# Patient Record
Sex: Male | Born: 1975 | Race: White | Hispanic: No | Marital: Married | Smoking: Former smoker
Health system: Southern US, Community
[De-identification: ages and names within clinical notes are randomized; demographics above are authoritative.]

## PROBLEM LIST (undated history)

## (undated) DIAGNOSIS — M549 Dorsalgia, unspecified: Secondary | ICD-10-CM

## (undated) DIAGNOSIS — I340 Nonrheumatic mitral (valve) insufficiency: Secondary | ICD-10-CM

## (undated) DIAGNOSIS — M1990 Unspecified osteoarthritis, unspecified site: Secondary | ICD-10-CM

## (undated) HISTORY — DX: Nonrheumatic mitral (valve) insufficiency: I34.0

---

## 2009-06-16 HISTORY — PX: HERNIA REPAIR: SHX51

## 2017-03-03 ENCOUNTER — Ambulatory Visit (INDEPENDENT_AMBULATORY_CARE_PROVIDER_SITE_OTHER): Payer: BLUE CROSS/BLUE SHIELD | Admitting: Neurology

## 2017-03-03 ENCOUNTER — Encounter (INDEPENDENT_AMBULATORY_CARE_PROVIDER_SITE_OTHER): Payer: Self-pay | Admitting: Neurology

## 2017-03-03 DIAGNOSIS — G5601 Carpal tunnel syndrome, right upper limb: Secondary | ICD-10-CM | POA: Diagnosis not present

## 2017-03-03 DIAGNOSIS — Z0289 Encounter for other administrative examinations: Secondary | ICD-10-CM

## 2017-03-03 DIAGNOSIS — M79641 Pain in right hand: Secondary | ICD-10-CM | POA: Diagnosis not present

## 2017-03-03 NOTE — Progress Notes (Signed)
Full Name: Sean Cuevas Gender: Male MRN #: [id number] Date of Birth: 1975/11/25    Visit Date: 03/03/2017 13:43 Age: 41 Years 3 Months Old Referring Physician: Dr Nicholos Johns    History: Sean Cuevas is a 41 year old man with several months of numbness and pain in the hands, right much more than left.    Also over the last 2 months he has noted mild weakness in the right hand. On examination he has numbness over the thenar eminence on the right. The right APB muscle is mildly weak. He has a positive Tinel sign and Phalen's sign, right much more than left.  Nerve conduction studies:   The right median motor response had a delayed distal latency but normal amplitude and forearm conduction. The left median motor, left ulnar motor and right ulnar motor had normal distal latencies, amplitudes and forearm/upper arm conduction velocity.   Right median sensory response had a delayed peak latency and low normal amplitude. Bilateral median and ulnar F-wave latencies were normal The left median sensory response in both ulnar sensory responses had normal peak latencies and amplitudes.  Needle EMG:  Needle EMG of of the right deltoid, triceps, biceps, EDC, first dorsal interosseous and APB muscles.  There was no abnormal spontaneous activity noted in any of the muscles. All the muscles had normal motor unit action potentials and recruitment.  Impression: 1.    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    No evidence of superimposed polyneuropathy or radiculopathy.  Sean A. Epimenio Foot, MD, PhD, FAAN Certified in Neurology, Clinical Neurophysiology, Sleep Medicine, Pain Medicine and Neuroimaging Director, [facility]  [facility] [address] [place], [place] [address] [phone]   ADDENDUM:   Using sterile technique, the right carpal tunnel was injected with 1.5 mg Decadron in 0.8  mL lidocaine. He tolerated the procedure well and there were no complications. Pain was better afterwards.   We discussed that he might benefit from surgery.  --- RAS      MNC    Nerve / Sites Muscle Latency Ref. Amplitude Ref. Rel Amp Segments Distance Velocity Ref. Area    ms ms mV mV %  cm m/s m/s mVms  L Median - APB     Wrist APB 3.5 ?4.4 12.4 ?4.0 100 Wrist - APB 7   46.0     Upper arm APB 7.8  10.3  83.5 Upper arm - Wrist 27 62 ?49 36.3  R Median - APB     Wrist APB 4.8 ?4.4 10.8 ?4.0 100 Wrist - APB 7   40.3     Upper arm APB 8.0  10.4  96.1 Upper arm - Wrist 27 84 ?49 37.1  L Ulnar - ADM     Wrist ADM 2.8 ?3.3 13.2 ?6.0 100 Wrist - ADM 7   43.9     B.Elbow ADM 7.0  12.6  95.2 B.Elbow - Wrist 23 55 ?49 41.6     A.Elbow ADM 6.8  11.4  90.5 A.Elbow - B.Elbow 10 640 ?49 39.2         A.Elbow - Wrist      R Ulnar - ADM     Wrist ADM 2.4 ?3.3 10.7 ?6.0 100 Wrist - ADM 7   28.9     B.Elbow ADM 7.1  9.7  90.6 B.Elbow - Wrist 25 54 ?49 28.2     A.Elbow ADM  8.6  9.2  94.8 A.Elbow - B.Elbow 10 64 ?49 27.8         A.Elbow - Wrist                 SNC    Nerve / Sites Rec. Site Peak Lat Amp Segments Distance    ms V  cm  L Median - Orthodromic (Dig II, Mid palm)     Dig II Wrist 3.4 30 Dig II - Wrist 13  R Median - Orthodromic (Dig II, Mid palm)     Dig II Wrist 4.4 22 Dig II - Wrist 13  L Ulnar - Orthodromic, (Dig V, Mid palm)     Dig V Wrist 3.1 37 Dig V - Wrist 11  R Ulnar - Orthodromic, (Dig V, Mid palm)     Dig V Wrist 3.1 30 Dig V - Wrist 82             F  Wave    Nerve F Lat Ref.   ms ms  L Median - APB 31.8 ?31.0  L Ulnar - ADM 30.8 ?32.0  R Median - APB 28.5 ?31.0  R Ulnar - ADM 29.8 ?32.0             EMG       EMG Summary Table    Spontaneous MUAP Recruitment  Muscle IA Fib PSW Fasc Other Amp Dur. Poly Pattern  R. Deltoid Normal None None None _______ Normal Normal Normal Normal  R. Triceps brachii Normal None None None _______ Normal Normal Normal Normal    R. Biceps brachii Normal None None None _______ Normal Normal Normal Normal  R. Extensor digitorum communis Normal None None None _______ Normal Normal Normal Normal  R. First dorsal interosseous Normal None None None _______ Normal Normal Normal Normal   R. APB Normal None None None _______ Normal Normal Normal Normal

## 2017-08-30 ENCOUNTER — Encounter (HOSPITAL_COMMUNITY): Payer: Self-pay | Admitting: Emergency Medicine

## 2017-08-30 ENCOUNTER — Emergency Department (HOSPITAL_COMMUNITY)
Admission: EM | Admit: 2017-08-30 | Discharge: 2017-08-30 | Disposition: A | Discharge status: Home / Self Care | Payer: Commercial Managed Care - PPO | Attending: Emergency Medicine | Admitting: Emergency Medicine

## 2017-08-30 DIAGNOSIS — Y9389 Activity, other specified: Secondary | ICD-10-CM | POA: Insufficient documentation

## 2017-08-30 DIAGNOSIS — Y929 Unspecified place or not applicable: Secondary | ICD-10-CM | POA: Insufficient documentation

## 2017-08-30 DIAGNOSIS — S79922A Unspecified injury of left thigh, initial encounter: Secondary | ICD-10-CM | POA: Diagnosis present

## 2017-08-30 DIAGNOSIS — M79605 Pain in left leg: Secondary | ICD-10-CM | POA: Diagnosis not present

## 2017-08-30 DIAGNOSIS — S71112A Laceration without foreign body, left thigh, initial encounter: Secondary | ICD-10-CM | POA: Diagnosis not present

## 2017-08-30 DIAGNOSIS — S76122A Laceration of left quadriceps muscle, fascia and tendon, initial encounter: Secondary | ICD-10-CM | POA: Diagnosis not present

## 2017-08-30 DIAGNOSIS — Z23 Encounter for immunization: Secondary | ICD-10-CM | POA: Insufficient documentation

## 2017-08-30 DIAGNOSIS — W293XXA Contact with powered garden and outdoor hand tools and machinery, initial encounter: Secondary | ICD-10-CM | POA: Diagnosis not present

## 2017-08-30 DIAGNOSIS — T148XXA Other injury of unspecified body region, initial encounter: Secondary | ICD-10-CM | POA: Diagnosis not present

## 2017-08-30 DIAGNOSIS — Y999 Unspecified external cause status: Secondary | ICD-10-CM | POA: Diagnosis not present

## 2017-08-30 DIAGNOSIS — S76112A Strain of left quadriceps muscle, fascia and tendon, initial encounter: Secondary | ICD-10-CM | POA: Diagnosis not present

## 2017-08-30 HISTORY — DX: Unspecified osteoarthritis, unspecified site: M19.90

## 2017-08-30 HISTORY — DX: Dorsalgia, unspecified: M54.9

## 2017-08-30 MED ORDER — CEPHALEXIN 500 MG PO CAPS: 500.0000 mg | ORAL_CAPSULE | Freq: Three times a day (TID) | ORAL | 0 refills | Status: DC

## 2017-08-30 MED ORDER — HYDROMORPHONE HCL 1 MG/ML IJ SOLN
0.5000 mg | Freq: Once | INTRAMUSCULAR | Status: AC
  Administered 2017-08-30: 0.5 mg via INTRAVENOUS
  Filled 2017-08-30: qty 1

## 2017-08-30 MED ORDER — LIDOCAINE-EPINEPHRINE (PF) 2 %-1:200000 IJ SOLN
10.0000 mL | Freq: Once | INTRAMUSCULAR | Status: AC
  Administered 2017-08-30: 10 mL via INTRADERMAL
  Filled 2017-08-30: qty 20

## 2017-08-30 MED ORDER — TETANUS-DIPHTH-ACELL PERTUSSIS 5-2.5-18.5 LF-MCG/0.5 IM SUSP
0.5000 mL | Freq: Once | INTRAMUSCULAR | Status: AC
  Administered 2017-08-30: 0.5 mL via INTRAMUSCULAR
  Filled 2017-08-30: qty 0.5

## 2017-08-30 MED ORDER — HYDROCODONE-ACETAMINOPHEN 5-325 MG PO TABS: 1.0000 | ORAL_TABLET | Freq: Four times a day (QID) | ORAL | 0 refills | Status: DC | PRN

## 2017-08-30 NOTE — Progress Notes (Signed)
Orthopedic Tech Progress Note Patient Details:  Louanne SkyeJay [birth year]field Oct 31, 1975 [id number]  Ortho Devices Type of Ortho Device: Crutches, Knee Immobilizer Ortho Device/Splint Location: LLE Ortho Device/Splint Interventions: Ordered, Application, Adjustment   Post Interventions Patient Tolerated: Well Instructions Provided: Care of device   Jennye MoccasinHughes, Wandell Scullion Craig 08/30/2017, 8:52 PM

## 2017-08-30 NOTE — Discharge Instructions (Signed)
Crutches and immobilizer as needed.  Keep your wound clean.  Keflex for infection prevention as prescribed.  Norco for severe pain as needed.  Otherwise take Tylenol or Motrin for pain.  Please follow-up with Dr. Jena GaussHaddix in 1 week.
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
## 2017-08-30 NOTE — ED Notes (Signed)
Left thigh Laceration with a chain saw. Patient reports that he was cutting a tree when it happened

## 2017-08-30 NOTE — ED Triage Notes (Signed)
Patient presents to ED for assessment of laceration to left leg caused by a chainsaw.  Bleeding controlled".

## 2017-09-08 DIAGNOSIS — S76112A Strain of left quadriceps muscle, fascia and tendon, initial encounter: Secondary | ICD-10-CM | POA: Diagnosis not present

## 2017-10-20 DIAGNOSIS — Z125 Encounter for screening for malignant neoplasm of prostate: Secondary | ICD-10-CM | POA: Diagnosis not present

## 2017-10-20 DIAGNOSIS — Z0000 Encounter for general adult medical examination without abnormal findings: Secondary | ICD-10-CM | POA: Diagnosis not present

## 2017-10-27 DIAGNOSIS — Z0000 Encounter for general adult medical examination without abnormal findings: Secondary | ICD-10-CM | POA: Diagnosis not present

## 2017-10-27 DIAGNOSIS — M542 Cervicalgia: Secondary | ICD-10-CM | POA: Diagnosis not present

## 2017-10-27 DIAGNOSIS — Z125 Encounter for screening for malignant neoplasm of prostate: Secondary | ICD-10-CM | POA: Diagnosis not present

## 2018-03-25 DIAGNOSIS — M5136 Other intervertebral disc degeneration, lumbar region: Secondary | ICD-10-CM | POA: Diagnosis not present

## 2018-03-25 DIAGNOSIS — M25552 Pain in left hip: Secondary | ICD-10-CM | POA: Diagnosis not present

## 2018-03-25 DIAGNOSIS — M545 Low back pain: Secondary | ICD-10-CM | POA: Diagnosis not present

## 2018-04-08 DIAGNOSIS — Z23 Encounter for immunization: Secondary | ICD-10-CM | POA: Diagnosis not present

## 2018-04-08 DIAGNOSIS — M5441 Lumbago with sciatica, right side: Secondary | ICD-10-CM | POA: Diagnosis not present

## 2018-04-21 DIAGNOSIS — M545 Low back pain: Secondary | ICD-10-CM | POA: Diagnosis not present

## 2018-04-25 DIAGNOSIS — J069 Acute upper respiratory infection, unspecified: Secondary | ICD-10-CM | POA: Diagnosis not present

## 2018-04-27 DIAGNOSIS — M5416 Radiculopathy, lumbar region: Secondary | ICD-10-CM | POA: Diagnosis not present

## 2018-05-04 DIAGNOSIS — M5416 Radiculopathy, lumbar region: Secondary | ICD-10-CM | POA: Diagnosis not present

## 2018-05-07 DIAGNOSIS — M5416 Radiculopathy, lumbar region: Secondary | ICD-10-CM | POA: Diagnosis not present

## 2018-05-18 DIAGNOSIS — M5416 Radiculopathy, lumbar region: Secondary | ICD-10-CM | POA: Diagnosis not present

## 2018-05-18 DIAGNOSIS — M5417 Radiculopathy, lumbosacral region: Secondary | ICD-10-CM | POA: Diagnosis not present

## 2018-05-27 DIAGNOSIS — M5416 Radiculopathy, lumbar region: Secondary | ICD-10-CM | POA: Diagnosis not present

## 2018-06-01 DIAGNOSIS — M5416 Radiculopathy, lumbar region: Secondary | ICD-10-CM | POA: Diagnosis not present

## 2018-06-16 DIAGNOSIS — M5136 Other intervertebral disc degeneration, lumbar region: Secondary | ICD-10-CM

## 2018-06-16 DIAGNOSIS — M5126 Other intervertebral disc displacement, lumbar region: Secondary | ICD-10-CM

## 2018-06-16 DIAGNOSIS — M51369 Other intervertebral disc degeneration, lumbar region without mention of lumbar back pain or lower extremity pain: Secondary | ICD-10-CM

## 2018-06-16 HISTORY — DX: Other intervertebral disc degeneration, lumbar region without mention of lumbar back pain or lower extremity pain: M51.369

## 2018-06-16 HISTORY — DX: Other intervertebral disc degeneration, lumbar region: M51.36

## 2018-06-16 HISTORY — DX: Other intervertebral disc displacement, lumbar region: M51.26

## 2018-06-18 DIAGNOSIS — M5136 Other intervertebral disc degeneration, lumbar region: Secondary | ICD-10-CM | POA: Diagnosis not present

## 2018-06-18 DIAGNOSIS — M5416 Radiculopathy, lumbar region: Secondary | ICD-10-CM | POA: Diagnosis not present

## 2018-10-28 DIAGNOSIS — K6289 Other specified diseases of anus and rectum: Secondary | ICD-10-CM | POA: Diagnosis not present

## 2018-10-28 DIAGNOSIS — K649 Unspecified hemorrhoids: Secondary | ICD-10-CM | POA: Diagnosis not present

## 2019-08-01 ENCOUNTER — Emergency Department (HOSPITAL_COMMUNITY)
Admission: EM | Admit: 2019-08-01 | Discharge: 2019-08-01 | Disposition: A | Discharge status: Home / Self Care | Payer: Commercial Managed Care - PPO | Attending: Emergency Medicine | Admitting: Emergency Medicine

## 2019-08-01 ENCOUNTER — Other Ambulatory Visit: Payer: Self-pay

## 2019-08-01 ENCOUNTER — Emergency Department (HOSPITAL_COMMUNITY): Payer: Commercial Managed Care - PPO

## 2019-08-01 DIAGNOSIS — Z79899 Other long term (current) drug therapy: Secondary | ICD-10-CM | POA: Diagnosis not present

## 2019-08-01 DIAGNOSIS — R519 Headache, unspecified: Secondary | ICD-10-CM

## 2019-08-01 MED ORDER — PROCHLORPERAZINE MALEATE 10 MG PO TABS
10.0000 mg | ORAL_TABLET | Freq: Once | ORAL | Status: AC
  Administered 2019-08-01: 10 mg via ORAL
  Filled 2019-08-01: qty 1

## 2019-08-01 MED ORDER — DIPHENHYDRAMINE HCL 25 MG PO CAPS
25.0000 mg | ORAL_CAPSULE | Freq: Once | ORAL | Status: AC
  Administered 2019-08-01: 25 mg via ORAL
  Filled 2019-08-01: qty 1

## 2019-08-01 MED ORDER — ONDANSETRON HCL 4 MG PO TABS
4.0000 mg | ORAL_TABLET | Freq: Three times a day (TID) | ORAL | 0 refills | Status: AC | PRN
Start: 2019-08-01 — End: 2019-08-16

## 2019-08-01 NOTE — ED Notes (Signed)
Discharge paperwork reviewed with pt, including prescription.  Pt with no questions or concerns at this time.  Ambulatory at discharge.
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
## 2019-08-01 NOTE — ED Notes (Signed)
Pt transported to CT ?

## 2019-09-23 ENCOUNTER — Ambulatory Visit: Payer: Commercial Managed Care - PPO

## 2020-01-16 ENCOUNTER — Ambulatory Visit: Payer: Commercial Managed Care - PPO | Admitting: Cardiology

## 2020-01-16 ENCOUNTER — Encounter: Payer: Self-pay | Admitting: Cardiology

## 2020-01-16 ENCOUNTER — Other Ambulatory Visit: Payer: Self-pay

## 2020-01-16 VITALS — BP 116/77 | HR 63 | Resp 15 | Ht 73.0 in | Wt 209.0 lb

## 2020-01-16 DIAGNOSIS — R9431 Abnormal electrocardiogram [ECG] [EKG]: Secondary | ICD-10-CM

## 2020-01-16 DIAGNOSIS — R072 Precordial pain: Secondary | ICD-10-CM

## 2020-01-16 DIAGNOSIS — Z87891 Personal history of nicotine dependence: Secondary | ICD-10-CM

## 2020-01-16 DIAGNOSIS — Z8249 Family history of ischemic heart disease and other diseases of the circulatory system: Secondary | ICD-10-CM

## 2020-01-16 MED ORDER — METOPROLOL TARTRATE 25 MG PO TABS: 25.0000 mg | ORAL_TABLET | Freq: Two times a day (BID) | ORAL | 0 refills | Status: DC

## 2020-01-16 NOTE — Progress Notes (Signed)
[name] Date of Birth: 12/14/1975 MRN: [id number] Primary Care Provider:Ramachandran, Mauro Kaufmann, MD Primary Cardiologist: Rex Kras, DO, [facility] (established care 01/16/2020)  Date: 01/16/20  Chief Complaint  Patient presents with  . New Patient (Initial Visit)  . HX cardiac disease    HPI   Rodney Yera is a 44 y.o.  male who presents to the office with a chief complaint of "family history of heart disease." Patient's past medical history and cardiovascular risk factors include: Family history premature coronary artery disease, former smoker.  Patient was referred to the office at the request of his primary care provider for evaluation of coronary artery disease given his strong family history of premature CAD in the family. Patient states that he at times has chest discomfort but has not thought much of it. He states that the discomfort usually happens several times a year, substernally located, no predisposing factors, 7 out of 10 in intensity, describes it as " crack in the sternum," nonradiating, last for about 15 seconds, not brought on by effort related activities, not resolved with rest.  Patient states that his father had a myocardial infarction at the age of 64. His half brother had a myocardial infarction at the age of 18, and nephew had cardiac transplant for reasons unknown.  Denies prior history of coronary artery disease, myocardial infarction, congestive heart failure, deep venous thrombosis, pulmonary embolism, stroke, transient ischemic attack.  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 No structured exercise program or daily routine.    ALLERGIES: Allergies  Allergen Reactions  . Morphine And Related Itching   MEDICATION LIST PRIOR TO VISIT: Current Outpatient Medications on File Prior to Visit  Medication Sig Dispense Refill  . IBUPROFEN PO Take 1 tablet by mouth as needed for pain.     No current facility-administered medications on  file prior to visit.    PAST MEDICAL HISTORY: Past Medical History:  Diagnosis Date  . Arthritis    of the back  . Back pain   . Bulging lumbar disc 2020   Three diagnosed.    PAST SURGICAL HISTORY: Past Surgical History:  Procedure Laterality Date  . HERNIA REPAIR  2011    FAMILY HISTORY: The patient's family history includes Cancer in his mother; Heart failure in his father.   SOCIAL HISTORY:  The patient  reports that he has quit smoking. His smoking use included cigarettes. He has a 7.50 pack-year smoking history. He has never used smokeless tobacco. He reports current alcohol use. He reports that he does not use drugs.  Review of Systems  Constitutional: Negative for chills and fever.  HENT: Negative for hoarse voice and nosebleeds.   Eyes: Negative for discharge, double vision and pain.  Cardiovascular: Positive for chest pain. Negative for claudication, dyspnea on exertion, leg swelling, near-syncope, orthopnea, palpitations, paroxysmal nocturnal dyspnea and syncope.  Respiratory: Negative for hemoptysis and shortness of breath.   Musculoskeletal: Negative for muscle cramps and myalgias.  Gastrointestinal: Negative for abdominal pain, constipation, diarrhea, hematemesis, hematochezia, melena, nausea and vomiting.  Neurological: Negative for dizziness and light-headedness.   PHYSICAL EXAM: Vitals with BMI 01/16/2020 08/01/2019 08/01/2019  Height '6\' 1"'$  '6\' 1"'$  -  Weight 209 lbs 200 lbs -  BMI 25.85 27.78 -  Systolic 242 - 353  Diastolic 77 - 98  Pulse 63 - 65   CONSTITUTIONAL: Well-developed and well-nourished. No acute distress.  SKIN: Skin is warm and dry. No rash noted. No cyanosis. No pallor. No  jaundice HEAD: Normocephalic and atraumatic.  EYES: No scleral icterus MOUTH/THROAT: Moist oral membranes.  NECK: No JVD present. No thyromegaly noted. No carotid bruits  LYMPHATIC: No visible cervical adenopathy.  CHEST Normal respiratory effort. No intercostal  retractions  LUNGS: Clear to auscultation bilaterally. No stridor. No wheezes. No rales.  CARDIOVASCULAR: Regular rate and rhythm, positive S1-S2, no murmurs rubs or gallops appreciated. ABDOMINAL: No apparent ascites.  EXTREMITIES: No peripheral edema  HEMATOLOGIC: No significant bruising NEUROLOGIC: Oriented to person, place, and time. Nonfocal. Normal muscle tone.  PSYCHIATRIC: Normal mood and affect. Normal behavior. Cooperative  CARDIAC DATABASE: EKG: 01/16/2020: Sinus bradycardia, 57 bpm, right axis deviation, nonspecific T wave abnormality, without underlying injury pattern.  Echocardiogram: None  Stress Testing:  None  Heart Catheterization: None  LABORATORY DATA:  External Labs: Collected: 11/29/2019 Hemoglobin 15.3 g/dL. Creatinine 0.92 mg/dL. eGFR: 102 mL/min per 1.73 m Hemoglobin A1c 5.2 Lipid profile: Total cholesterol 224, triglycerides 104, HDL 59, LDL 144, non-HDL 147 TSH 1.16  IMPRESSION:    ICD-10-CM   1. Family history of premature CAD  Z82.49 EKG 12-Lead    PCV ECHOCARDIOGRAM COMPLETE    CT CORONARY MORPH W/CTA COR W/SCORE W/CA W/CM &/OR WO/CM    metoprolol tartrate (LOPRESSOR) 25 MG tablet    PCV CARDIAC STRESS TEST  2. Former smoker  Z87.891   3. Precordial pain  R07.2 PCV ECHOCARDIOGRAM COMPLETE    CT CORONARY MORPH W/CTA COR W/SCORE W/CA W/CM &/OR WO/CM    CT CORONARY FRACTIONAL FLOW RESERVE DATA PREP    CT CORONARY FRACTIONAL FLOW RESERVE FLUID ANALYSIS    PCV CARDIAC STRESS TEST  4. Nonspecific abnormal electrocardiogram (ECG) (EKG)  R94.31 PCV CARDIAC STRESS TEST     RECOMMENDATIONS: Jeven Topper is a 44 y.o. male whose past medical history and cardiovascular risk factors include: Family history of premature coronary artery disease, former smoker.  Family history of premature coronary artery disease:  Patient has a strong family history of premature CAD, his chest discomfort that he experiences occasionally appears to be atypical in  nature.   Echocardiogram will be ordered to evaluate for structural heart disease and left ventricular systolic function.  Plan exercise treadmill stress test  Plan coronary CTA with FFR. Patient is recommended to start metoprolol 2 days before and take 2 hours before the study.  Educated on a low-salt diet.  Patient total cholesterol greater than 200 and non-HDL is greater than 130 mg/dL. We discussed pharmacological therapy versus lifestyle modifications given his strong family history of CAD. Shared decision was to work on lifestyle modifications and to have his lipids rechecked in 6 weeks.  EKG shows normal sinus rhythm without underlying injury pattern.  Precordial chest pain: See above  Former smoker: Educated on the importance of continued smoking cessation.  FINAL MEDICATION LIST END OF ENCOUNTER: Meds ordered this encounter  Medications  . metoprolol tartrate (LOPRESSOR) 25 MG tablet    Sig: Take 1 tablet (25 mg total) by mouth 2 (two) times daily for 5 days.    Dispense:  10 tablet    Refill:  0     Current Outpatient Medications:  .  IBUPROFEN PO, Take 1 tablet by mouth as needed for pain., Disp: , Rfl:  .  metoprolol tartrate (LOPRESSOR) 25 MG tablet, Take 1 tablet (25 mg total) by mouth 2 (two) times daily for 5 days., Disp: 10 tablet, Rfl: 0  Orders Placed This Encounter  Procedures  . CT CORONARY MORPH W/CTA COR W/SCORE W/CA W/CM &/  OR WO/CM  . CT CORONARY FRACTIONAL FLOW RESERVE DATA PREP  . CT CORONARY FRACTIONAL FLOW RESERVE FLUID ANALYSIS  . PCV CARDIAC STRESS TEST  . EKG 12-Lead  . PCV ECHOCARDIOGRAM COMPLETE   --Continue cardiac medications as reconciled in final medication list. --Return in about 6 weeks (around 02/27/2020) for re-evaluation of chest pain., review test results.. Or sooner if needed. --Continue follow-up with your primary care physician regarding the management of your other chronic comorbid conditions.  Patient's questions and concerns  were addressed to his satisfaction. He voices understanding of the instructions provided during this encounter.   During this visit I reviewed and updated: Tobacco history  allergies medication reconciliation  medical history  surgical history  family history  social history.  This note was created using a voice recognition software as a result there may be grammatical errors inadvertently enclosed that do not reflect the nature of this encounter. Every attempt is made to correct such errors.  Rex Kras, [place], [facility]  Pager: [phone] Office: [phone]

## 2020-01-23 ENCOUNTER — Other Ambulatory Visit: Payer: Self-pay

## 2020-01-23 ENCOUNTER — Ambulatory Visit: Payer: Commercial Managed Care - PPO

## 2020-01-23 DIAGNOSIS — Z8249 Family history of ischemic heart disease and other diseases of the circulatory system: Secondary | ICD-10-CM

## 2020-01-23 DIAGNOSIS — R9431 Abnormal electrocardiogram [ECG] [EKG]: Secondary | ICD-10-CM

## 2020-01-23 DIAGNOSIS — R072 Precordial pain: Secondary | ICD-10-CM

## 2020-01-23 MED ORDER — METOPROLOL TARTRATE 25 MG PO TABS: 25.0000 mg | ORAL_TABLET | Freq: Two times a day (BID) | ORAL | 0 refills | Status: DC

## 2020-01-30 ENCOUNTER — Telehealth: Payer: Self-pay

## 2020-01-30 NOTE — Telephone Encounter (Signed)
Called pt inform him about his stress test pt understood. Pt does not have an appt for his CTA due to insurance.

## 2020-01-30 NOTE — Telephone Encounter (Signed)
If insurance does not approve we can consider nuclear stress test.

## 2020-01-30 NOTE — Telephone Encounter (Signed)
-----   Message from [place], [place] sent at 01/29/2020  2:56 PM EDT ----- Patient exercised for a good duration of time and exercise EKG did not show concern for blockage.  But he did have extra beats that were isolated or occurred in a row.   Continue with coronary CTA as scheduled.  If insurance does not approve for coronary CTA please let the office know and we will order a nuclear stress test to further risk stratify.  Keep the echo appointment  I will see him in follow-up.

## 2020-01-31 ENCOUNTER — Ambulatory Visit: Payer: Commercial Managed Care - PPO

## 2020-01-31 ENCOUNTER — Telehealth: Payer: Self-pay

## 2020-01-31 ENCOUNTER — Other Ambulatory Visit: Payer: Self-pay

## 2020-01-31 DIAGNOSIS — R072 Precordial pain: Secondary | ICD-10-CM

## 2020-01-31 DIAGNOSIS — Z8249 Family history of ischemic heart disease and other diseases of the circulatory system: Secondary | ICD-10-CM

## 2020-02-01 ENCOUNTER — Telehealth: Payer: Self-pay

## 2020-02-01 NOTE — Telephone Encounter (Signed)
Done

## 2020-02-03 ENCOUNTER — Telehealth (HOSPITAL_COMMUNITY): Payer: Self-pay | Admitting: Emergency Medicine

## 2020-02-03 NOTE — Telephone Encounter (Signed)
Reaching out to patient to offer assistance regarding upcoming cardiac imaging study; pt verbalizes understanding of appt date/time, parking situation and where to check in, pre-test NPO status and medications ordered, and verified current allergies; name and call back number provided for further questions should they arise Deserie Dirks RN Navigator Cardiac Imaging [facility] [phone] office [phone] cell 

## 2020-02-06 ENCOUNTER — Ambulatory Visit (HOSPITAL_COMMUNITY)
Admission: RE | Admit: 2020-02-06 | Discharge: 2020-02-06 | Disposition: A | Discharge status: Home / Self Care | Payer: Commercial Managed Care - PPO | Source: Ambulatory Visit | Attending: Cardiology | Admitting: Cardiology

## 2020-02-06 ENCOUNTER — Other Ambulatory Visit: Payer: Self-pay

## 2020-02-06 ENCOUNTER — Encounter (HOSPITAL_COMMUNITY): Payer: Self-pay

## 2020-02-06 DIAGNOSIS — R072 Precordial pain: Secondary | ICD-10-CM | POA: Insufficient documentation

## 2020-02-06 DIAGNOSIS — Z8249 Family history of ischemic heart disease and other diseases of the circulatory system: Secondary | ICD-10-CM | POA: Diagnosis not present

## 2020-02-06 MED ORDER — NITROGLYCERIN 0.4 MG SL SUBL
0.8000 mg | SUBLINGUAL_TABLET | Freq: Once | SUBLINGUAL | Status: AC
  Administered 2020-02-06: 0.8 mg via SUBLINGUAL

## 2020-02-06 MED ORDER — NITROGLYCERIN 0.4 MG SL SUBL
SUBLINGUAL_TABLET | SUBLINGUAL | Status: AC
  Filled 2020-02-06: qty 2

## 2020-02-06 MED ORDER — IOHEXOL 350 MG/ML SOLN
80.0000 mL | Freq: Once | INTRAVENOUS | Status: AC | PRN
  Administered 2020-02-06: 80 mL via INTRAVENOUS

## 2020-02-27 ENCOUNTER — Other Ambulatory Visit: Payer: Self-pay

## 2020-02-27 ENCOUNTER — Ambulatory Visit: Payer: Commercial Managed Care - PPO | Admitting: Cardiology

## 2020-02-27 ENCOUNTER — Encounter: Payer: Self-pay | Admitting: Cardiology

## 2020-02-27 VITALS — BP 129/76 | HR 66 | Resp 16 | Ht 73.0 in | Wt 208.0 lb

## 2020-02-27 DIAGNOSIS — I340 Nonrheumatic mitral (valve) insufficiency: Secondary | ICD-10-CM

## 2020-02-27 DIAGNOSIS — Z712 Person consulting for explanation of examination or test findings: Secondary | ICD-10-CM

## 2020-02-27 DIAGNOSIS — Z8249 Family history of ischemic heart disease and other diseases of the circulatory system: Secondary | ICD-10-CM

## 2020-02-27 DIAGNOSIS — Z87891 Personal history of nicotine dependence: Secondary | ICD-10-CM

## 2020-02-27 NOTE — Progress Notes (Signed)
Sean Cuevas Date of Birth: 1976-04-20 MRN: [id number] Primary Care Provider:Ramachandran, Mauro Kaufmann, MD Primary Cardiologist: Rex Kras, DO, [facility] (established care 01/16/2020)  Date: 02/27/20 Last Office Visit: 01/16/2020  Chief Complaint  Patient presents with  . Chest Pain    Re-evaluation  . Follow-up    6 week    HPI   Sean Cuevas is a 44 y.o.  male who presents to the office with a chief complaint of " reevaluation of chest pain and review test results." Patient's past medical history and cardiovascular risk factors include: Family history premature coronary artery disease, former smoker.  Patient was referred to the office at the request of his primary care provider for evaluation of coronary artery disease given his strong family history of premature CAD in the family.  At last office visit patient symptoms of chest discomfort were atypical in nature; however, given his multiple cardiovascular risk factors recommended that he undergo coronary CTA as his exercise treadmill stress test noted PVCs and NSVT.  Since last visit patient has undergone coronary CTA and his total coronary calcification score is 0 and no evidence of CAD per report.  Patient denies chest pain or shortness of breath at rest or with effort related activities.  Results of echo reviewed with him at today's visit and noted below for further reference.   Patient states that his father had a myocardial infarction at the age of 20. His half brother had a myocardial infarction at the age of 48, and nephew had cardiac transplant for reasons unknown.  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 No structured exercise program or daily routine.    ALLERGIES: Allergies  Allergen Reactions  . Morphine And Related Itching   MEDICATION LIST PRIOR TO VISIT: Current Outpatient Medications on File Prior to Visit  Medication Sig Dispense Refill  . IBUPROFEN PO Take 1 tablet by mouth as needed for  pain.     No current facility-administered medications on file prior to visit.    PAST MEDICAL HISTORY: Past Medical History:  Diagnosis Date  . Arthritis    of the back  . Back pain   . Bulging lumbar disc 2020   Three diagnosed.    PAST SURGICAL HISTORY: Past Surgical History:  Procedure Laterality Date  . HERNIA REPAIR  2011    FAMILY HISTORY: The patient's family history includes Cancer in his mother; Heart failure in his father.   SOCIAL HISTORY:  The patient  reports that he has quit smoking. His smoking use included cigarettes. He has a 7.50 pack-year smoking history. He has never used smokeless tobacco. He reports current alcohol use. He reports that he does not use drugs.  Review of Systems  Constitutional: Negative for chills and fever.  HENT: Negative for hoarse voice and nosebleeds.   Eyes: Negative for discharge, double vision and pain.  Cardiovascular: Negative for chest pain, claudication, dyspnea on exertion, leg swelling, near-syncope, orthopnea, palpitations, paroxysmal nocturnal dyspnea and syncope.  Respiratory: Negative for hemoptysis and shortness of breath.   Musculoskeletal: Negative for muscle cramps and myalgias.  Gastrointestinal: Negative for abdominal pain, constipation, diarrhea, hematemesis, hematochezia, melena, nausea and vomiting.  Neurological: Negative for dizziness and light-headedness.   PHYSICAL EXAM: Vitals with BMI 02/27/2020 02/06/2020 02/06/2020  Height 6' 1"  - -  Weight 208 lbs - -  BMI 40.98 - -  Systolic 119 97 92  Diastolic 76 66 57  Pulse 66 51 49   CONSTITUTIONAL: Well-developed and well-nourished.  No acute distress.  SKIN: Skin is warm and dry. No rash noted. No cyanosis. No pallor. No jaundice HEAD: Normocephalic and atraumatic.  EYES: No scleral icterus MOUTH/THROAT: Moist oral membranes.  NECK: No JVD present. No thyromegaly noted. No carotid bruits  LYMPHATIC: No visible cervical adenopathy.  CHEST Normal  respiratory effort. No intercostal retractions  LUNGS: Clear to auscultation bilaterally. No stridor. No wheezes. No rales.  CARDIOVASCULAR: Regular rate and rhythm, positive S1-S2, no murmurs rubs or gallops appreciated. ABDOMINAL: No apparent ascites.  EXTREMITIES: No peripheral edema  HEMATOLOGIC: No significant bruising NEUROLOGIC: Oriented to person, place, and time. Nonfocal. Normal muscle tone.  PSYCHIATRIC: Normal mood and affect. Normal behavior. Cooperative  CARDIAC DATABASE: EKG: 01/16/2020: Sinus bradycardia, 57 bpm, right axis deviation, nonspecific T wave abnormality, without underlying injury pattern.  Echocardiogram: 01/31/2020:  Left ventricle cavity is normal in size and wall thickness. Normal global wall motion. Normal LV systolic function with EF 57%. Normal diastolic filling pattern.  Left atrial cavity is moderately dilated at 47 cc/m2.  Mild bileaflet prolapse with moderate (Grade II) mitral regurgitation.  Mild tricuspid regurgitation. Estimated pulmonary artery systolic pressure 24 mmHg.  Stress Testing:  Exercise treadmill stress test 01/23/2020:  Exercise treadmill stress test performed using Bruce protocol. Patient reached 13.6 METS, and 107% of age predicted maximum heart rate. Exercise capacity was excellent. No chest pain reported. Normal heart rate and hemodynamic response. Stress EKG revealed no ischemic changes. However, it did show frequent PVCs and and self limiting 4-6 beat non-sustained ventricular tachycardia episodes.  Recommend clinical correlation.   Heart Catheterization: None  LABORATORY DATA:  External Labs: Collected: 11/29/2019 Hemoglobin 15.3 g/dL. Creatinine 0.92 mg/dL. eGFR: 102 mL/min per 1.73 m Hemoglobin A1c 5.2 Lipid profile: Total cholesterol 224, triglycerides 104, HDL 59, LDL 144, non-HDL 147 TSH 1.16  IMPRESSION:    ICD-10-CM   1. Nonrheumatic mitral valve regurgitation  I34.0 PCV ECHOCARDIOGRAM COMPLETE  2. Family  history of premature CAD  Z82.49   3. Former smoker  Z87.891   4. Encounter to discuss test results  Z71.2      RECOMMENDATIONS: Sean Cuevas is a 44 y.o. male whose past medical history and cardiovascular risk factors include: Family history of premature coronary artery disease, former smoker.  Moderate mitral regurgitation:  Most recent echocardiogram noted by bileaflet prolapse with moderate mitral regurgitation.  Recommend repeating echocardiogram in 1 year to reevaluate severity of MR.  Patient educated on the importance of blood pressure management.  He is asked to keep a log of his blood pressures and to review it with his primary care provider.  Recommended avoiding isometric contractions and prolonged Valsalva maneuver.  Patient educated on symptoms of acute MR and if they surface he needs to seek medical attention sooner by going to the closest ER via EMS.  Family history of premature coronary artery disease:  Total coronary artery calcification score 0.  No noted CAD recent coronary CTA.    Educated on importance of lifestyle modifications and improving his cardiovascular risk factors.    Educated on a low-salt diet.  Patient's total cholesterol greater than 200 and non-HDL is greater than 130 mg/dL.  Patient is advised to have his lipids rechecked with his PCP.  Former smoker: Educated on the importance of continued smoking cessation.  FINAL MEDICATION LIST END OF ENCOUNTER: No orders of the defined types were placed in this encounter.    Current Outpatient Medications:  .  IBUPROFEN PO, Take 1 tablet by mouth as needed  for pain., Disp: , Rfl:   Orders Placed This Encounter  Procedures  . PCV ECHOCARDIOGRAM COMPLETE   --Continue cardiac medications as reconciled in final medication list. --Return in about 1 year (around 02/26/2021) for Review echo, Reevaluation of dyspnea. Or sooner if needed. --Continue follow-up with your primary care physician regarding the  management of your other chronic comorbid conditions.  Patient's questions and concerns were addressed to his satisfaction. He voices understanding of the instructions provided during this encounter.   This note was created using a voice recognition software as a result there may be grammatical errors inadvertently enclosed that do not reflect the nature of this encounter. Every attempt is made to correct such errors.  Total time spent: 30 minutes.  Rex Kras, [place], [facility]  Pager: [phone] Office: [phone]

## 2021-01-14 ENCOUNTER — Other Ambulatory Visit: Payer: Self-pay | Admitting: Internal Medicine

## 2021-01-14 DIAGNOSIS — M79605 Pain in left leg: Secondary | ICD-10-CM

## 2021-01-15 ENCOUNTER — Ambulatory Visit
Admission: RE | Admit: 2021-01-15 | Discharge: 2021-01-15 | Disposition: A | Discharge status: Home / Self Care | Payer: Commercial Managed Care - PPO | Source: Ambulatory Visit | Attending: Internal Medicine | Admitting: Internal Medicine

## 2021-01-15 DIAGNOSIS — M79605 Pain in left leg: Secondary | ICD-10-CM

## 2021-02-12 ENCOUNTER — Other Ambulatory Visit: Payer: Commercial Managed Care - PPO

## 2021-02-26 ENCOUNTER — Ambulatory Visit: Payer: Commercial Managed Care - PPO | Admitting: Cardiology

## 2021-04-08 ENCOUNTER — Telehealth: Payer: Self-pay | Admitting: Cardiology

## 2021-04-08 NOTE — Telephone Encounter (Signed)
Dr. Odis Hollingshead - per Anabell, patient has no showed echo and office visit appointments.

## 2021-04-10 NOTE — Telephone Encounter (Signed)
Please document it and make an effort to reach out. Speak to Anabell if still unsuccessful.

## 2022-01-22 IMAGING — CT CT HEART MORP W/ CTA COR W/ SCORE W/ CA W/CM &/OR W/O CM
4 of 7 series · 8 of 20 positions shown, 9 images · IV contrast (APPLIED)
Comparison: None.
COMPARISON: None.

Addendum:
EXAM:
OVER-READ INTERPRETATION  CT CHEST

The following report is an over-read performed by radiologist Dr.
Gabule China [REDACTED] on 02/06/2020. This over-read
does not include interpretation of cardiac or coronary anatomy or
pathology. The coronary CTA interpretation by the cardiologist is
attached.
CLINICAL DATA: 44 yo male with chest pain
Cardiac/Coronary  CT
TECHNIQUE: The patient was scanned on a [employer] Force scanner.

[Series 6: best diast 69 % · axial · 0.39mm/px · z∈[-174,-123]mm · 2 of 382 slices shown, 3 images]
[im 128/382  vessel]
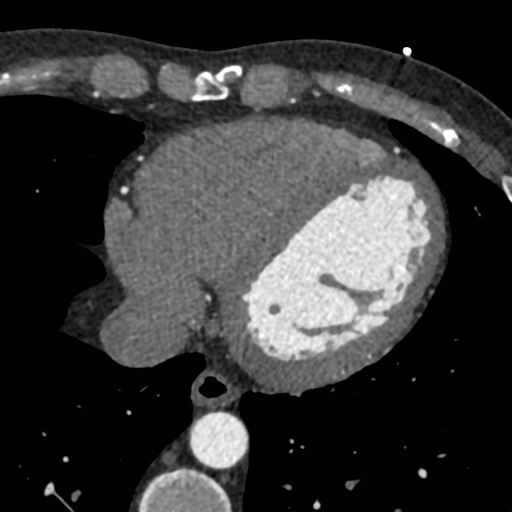
[im 128/382  lung]
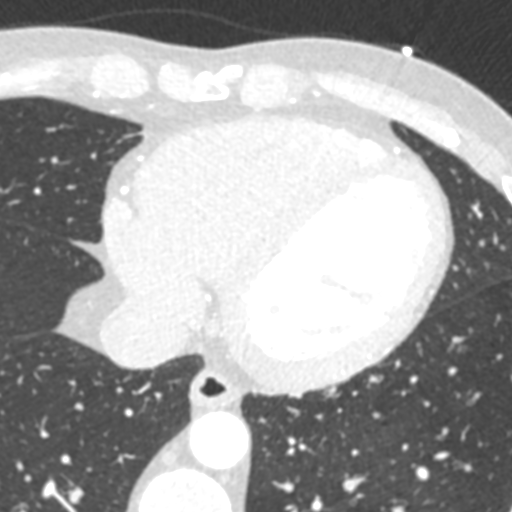
[im 255/382  vessel]
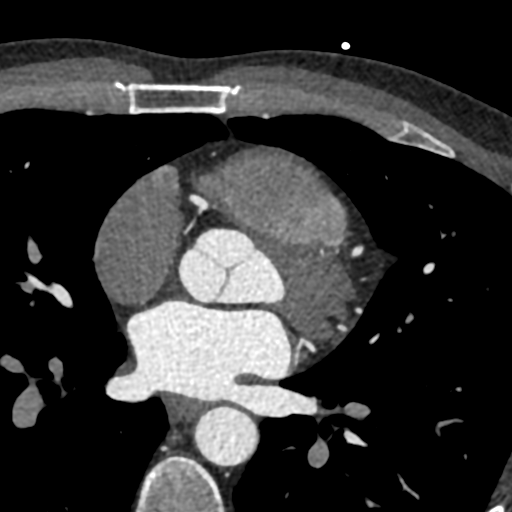

[Series 7: best syst 31 % · axial · 0.39mm/px · z∈[-174,-123]mm · 2 of 382 slices shown]
[im 128/382  vessel]
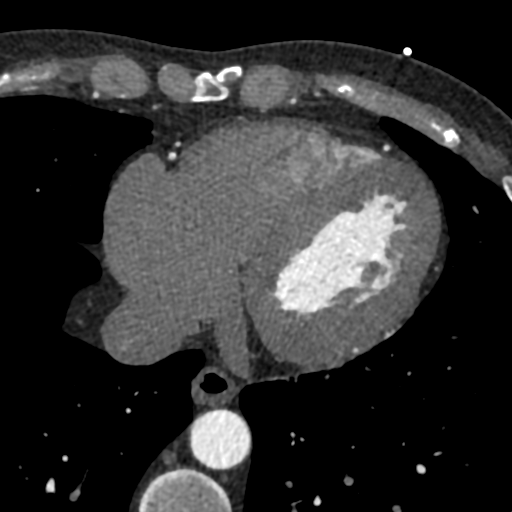
[im 255/382  vessel]
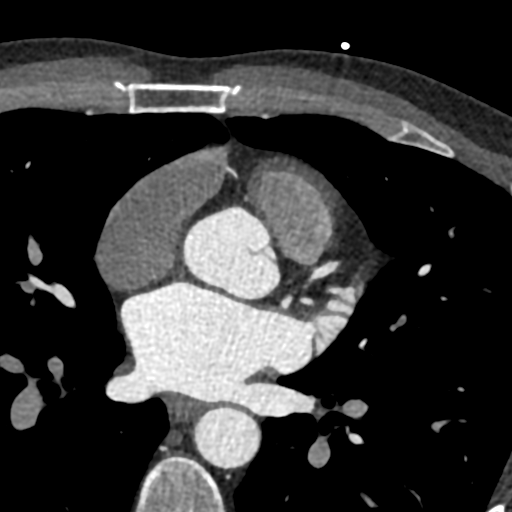

[Series 8: ts diast sharp 69 % · axial · 0.39mm/px · z∈[-174,-123]mm · 2 of 382 slices shown]
[im 128/382  lung]
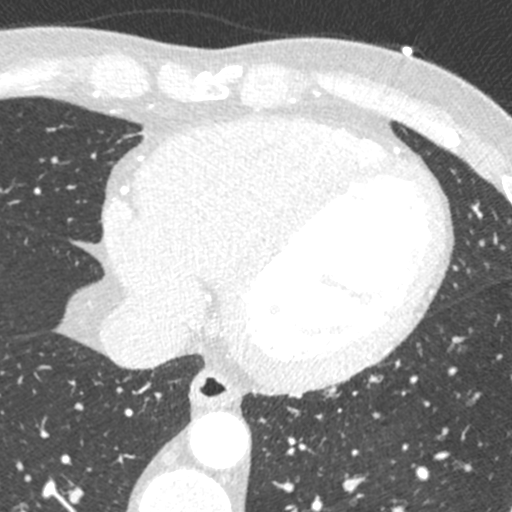
[im 255/382  lung]
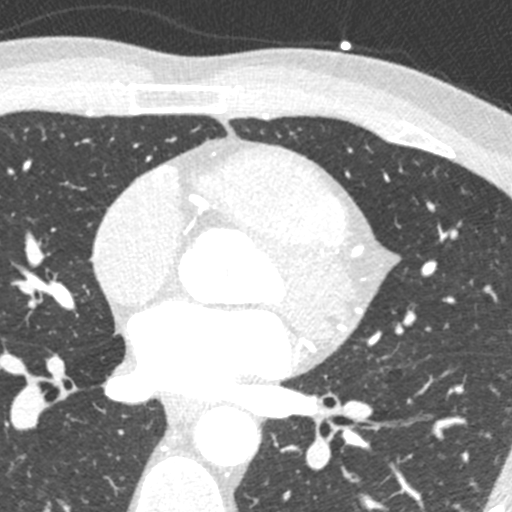

[Series 9: ts syst sharp 31 % · axial · 0.39mm/px · z∈[-174,-123]mm · 2 of 382 slices shown]
[im 128/382  lung]
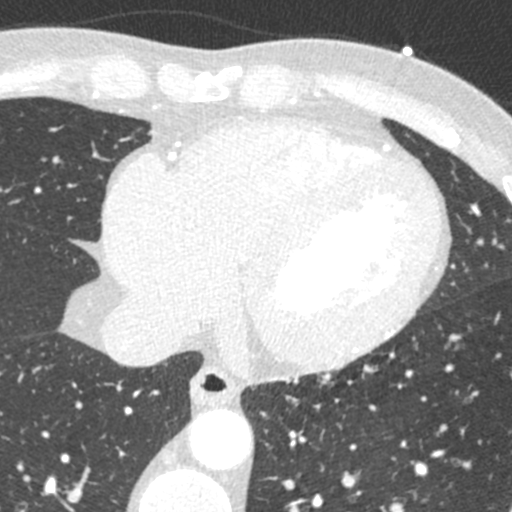
[im 255/382  lung]
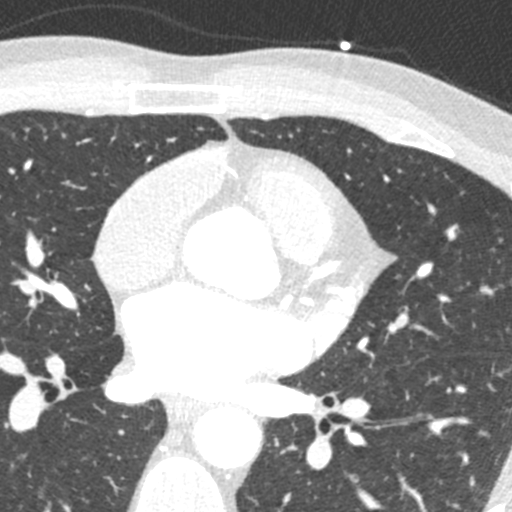

[8 of 20 positions shown; findings below may reference images not displayed]

FINDINGS: Vascular: Normal aortic caliber. No central pulmonary embolism, on
this non-dedicated study.

Mediastinum/Nodes: No imaged thoracic adenopathy.

Lungs/Pleura: No pleural fluid.  Clear imaged lungs.

Upper Abdomen: Normal imaged portions of the liver, spleen, stomach.

Musculoskeletal: No acute osseous abnormality.
IMPRESSION: No acute findings in the imaged extracardiac chest.
FINDINGS: A 120 kV prospective scan was triggered in the descending thoracic
aorta at 111 HU's. Axial non-contrast 3 mm slices were carried out
through the heart. The data set was analyzed on a dedicated work
station and scored using the Agatson method. Gantry rotation speed
was 250 msecs and collimation was .6 mm. No beta blockade and 0.8 mg
of sl NTG was given. The 3D data set was reconstructed in 5%
intervals of the 67-82 % of the R-R cycle. Diastolic phases were
analyzed on a dedicated work station using MPR, MIP and VRT modes.
The patient received 80 cc of contrast.

Aorta:  Normal size.  No calcifications.  No dissection.

Aortic Valve:  Trileaflet.  No calcifications.

Coronary Arteries:  Normal coronary origin.  Right dominance.

RCA is a large dominant artery that gives rise to PDA. There is no
plaque.

Left main is a large artery that gives rise to LAD, Ramus
intermedius and LCX arteries.

LAD is a large vessel that gives rise to large D1 and small D2;
there is no plaque.

Ramus intermedius is a large artery; there is no plaque.

LCX is a non-dominant artery that gives rise to one large OM1
branch. There is no plaque.

Other findings:

Normal pulmonary vein drainage into the left atrium.

Normal let atrial appendage without a thrombus.

Normal size of the pulmonary artery.
IMPRESSION: 1. Coronary calcium score of 0. This was 0 percentile for age and
sex matched control.

2. Normal coronary origin with right dominance.

3. No evidence of CAD; CAD-RADS 0.

[address]

*** End of Addendum ***
EXAM:
OVER-READ INTERPRETATION  CT CHEST

The following report is an over-read performed by radiologist Dr.
Gabule China [REDACTED] on 02/06/2020. This over-read
does not include interpretation of cardiac or coronary anatomy or
pathology. The coronary CTA interpretation by the cardiologist is
attached.
FINDINGS: Vascular: Normal aortic caliber. No central pulmonary embolism, on
this non-dedicated study.

Mediastinum/Nodes: No imaged thoracic adenopathy.

Lungs/Pleura: No pleural fluid.  Clear imaged lungs.

Upper Abdomen: Normal imaged portions of the liver, spleen, stomach.

Musculoskeletal: No acute osseous abnormality.
IMPRESSION: No acute findings in the imaged extracardiac chest.

## 2022-08-03 ENCOUNTER — Emergency Department (HOSPITAL_COMMUNITY): Payer: Commercial Managed Care - PPO

## 2022-08-03 ENCOUNTER — Emergency Department (HOSPITAL_COMMUNITY)
Admission: EM | Admit: 2022-08-03 | Discharge: 2022-08-03 | Disposition: A | Discharge status: Home / Self Care | Payer: Commercial Managed Care - PPO | Attending: Emergency Medicine | Admitting: Emergency Medicine

## 2022-08-03 DIAGNOSIS — S8992XA Unspecified injury of left lower leg, initial encounter: Secondary | ICD-10-CM | POA: Diagnosis present

## 2022-08-03 DIAGNOSIS — S81822A Laceration with foreign body, left lower leg, initial encounter: Secondary | ICD-10-CM | POA: Diagnosis not present

## 2022-08-03 DIAGNOSIS — Y92007 Garden or yard of unspecified non-institutional (private) residence as the place of occurrence of the external cause: Secondary | ICD-10-CM | POA: Diagnosis not present

## 2022-08-03 DIAGNOSIS — Y93H2 Activity, gardening and landscaping: Secondary | ICD-10-CM | POA: Diagnosis not present

## 2022-08-03 DIAGNOSIS — S81812A Laceration without foreign body, left lower leg, initial encounter: Secondary | ICD-10-CM

## 2022-08-03 DIAGNOSIS — W293XXA Contact with powered garden and outdoor hand tools and machinery, initial encounter: Secondary | ICD-10-CM | POA: Diagnosis not present

## 2022-08-03 DIAGNOSIS — Z87891 Personal history of nicotine dependence: Secondary | ICD-10-CM | POA: Insufficient documentation

## 2022-08-03 MED ORDER — ONDANSETRON HCL 4 MG/2ML IJ SOLN
4.0000 mg | Freq: Once | INTRAMUSCULAR | Status: AC
  Administered 2022-08-03: 4 mg via INTRAVENOUS
  Filled 2022-08-03: qty 2

## 2022-08-03 MED ORDER — CEPHALEXIN 500 MG PO CAPS: 500.0000 mg | ORAL_CAPSULE | Freq: Four times a day (QID) | ORAL | 0 refills | Status: AC

## 2022-08-03 MED ORDER — BUPIVACAINE-EPINEPHRINE (PF) 0.5% -1:200000 IJ SOLN: 20.0000 mL | Freq: Once | INTRAMUSCULAR | Status: DC

## 2022-08-03 MED ORDER — LIDOCAINE-EPINEPHRINE (PF) 2 %-1:200000 IJ SOLN
20.0000 mL | Freq: Once | INTRAMUSCULAR | Status: AC
  Administered 2022-08-03: 20 mL via INTRADERMAL
  Filled 2022-08-03: qty 20

## 2022-08-03 MED ORDER — OXYCODONE HCL 5 MG PO TABS: 5.0000 mg | ORAL_TABLET | ORAL | 0 refills | Status: AC | PRN

## 2022-08-03 MED ORDER — HYDROMORPHONE HCL 1 MG/ML IJ SOLN
1.0000 mg | Freq: Once | INTRAMUSCULAR | Status: AC
  Administered 2022-08-03: 1 mg via INTRAVENOUS
  Filled 2022-08-03: qty 1

## 2022-08-03 MED ORDER — IBUPROFEN 600 MG PO TABS: 600.0000 mg | ORAL_TABLET | Freq: Four times a day (QID) | ORAL | 0 refills | Status: AC | PRN

## 2022-08-03 NOTE — ED Notes (Signed)
Pt was chopping down a tree in the back yard  4 inch lac to the left tib/fib  Hit bone and was able to see bone  Pt was given 2g of anceph and 89m of ketamine   From [place]  4 inch laceration left anterior tib fib down to the bone  There was bone fragments AO the whole time BP was 100 palp  He was pale Last BP was 142/78  HR 74 sinus  99% on RA  Had pulse motor sensation  He co of decreased sensation in left foot about 15 minutes ago but he can still feel and wiggle his toes  18g in RAC -  Had 24mof ketamine and 2g of anceph\

## 2022-08-03 NOTE — ED Triage Notes (Signed)
Pt brought in by [facility] from home. Pt was chopping a tree in the back yard. Pt had a chainsaw accident and had a 4inch lac to the left tibia. Pt able to see bone. Pt given 2g of anceph and 40m of ketamine.   LVS  HR 72  BP 152/100  100 RA  18g placed by EMS

## 2022-08-03 NOTE — Discharge Instructions (Addendum)
We evaluated you in the emergency department for your laceration.  Your laceration was very deep and did have a small chip on your bone.  We removed all the bone fragments we saw in the laceration.  It is possible you may still have some bone fragments that we did not remove.  We also noticed that you had some signs of nerve injury to the small nerves that provide sensation to your skin.  These nerves may grow back but it will take a few months to tell if you will regain sensation.  The injury to your bone was very small and we did not even see it on the x-ray.  To prevent infection, we have started you on antibiotics for 5 days.  Please take this 4 times a day to prevent infection.  Please take Tylenol and Motrin for your symptoms at home.  You can take 650 mg of Tylenol every 6 hours and 600 mg of ibuprofen every 6 hours as needed for your symptoms.  You can take these medicines together as needed, either at the same time, or alternating every 3 hours.  I have also prescribed you small amount of oxycodone.  You can take this medication if your pain is unrelieved with Tylenol and Motrin.  Please also elevate your leg to help with swelling.  Please call orthopedic surgeon Dr. Griffin Basil for follow-up in 1 to 2 weeks for a recheck.   If you develop any new or worsening symptoms such as redness at the wound, increased pain at the wound, drainage of pus, fevers or chills, or any other concerning symptoms, please return to the emergency department so we can recheck your wound to assess for development of any infection.

## 2022-08-03 NOTE — ED Provider Notes (Signed)
[facility] Provider Note  CSN: [id number] Arrival date & time: 08/03/22 1448  Chief Complaint(s) chainsaw lac to leg  HPI Sean Cuevas is a 47 y.o. male without significant past medical history presenting to the emergency department with chainsaw wound to the left lower leg.  He reports that he was cutting trees when his chainsaw slipped and cut his left medial leg.  He reports that he found some bone fragments in his pants.  He has not tried to ambulate since the accident.  No, weakness, feels numbness extending down the leg at site of wound to the upper foot.  Wound is to the left medial lower leg.  EMS gave patient ketamine and 2 g of Ancef.  Happened today, just prior to arrival.   Past Medical History Past Medical History:  Diagnosis Date   Arthritis    of the back   Back pain    Bulging lumbar disc 2020   Three diagnosed.   Mitral regurgitation    Patient Active Problem List   Diagnosis Date Noted   Hand pain, right 03/03/2017   Carpal tunnel syndrome on right 03/03/2017   Home Medication(s) Prior to Admission medications   Medication Sig Start Date End Date Taking? Authorizing Provider  cephALEXin (KEFLEX) 500 MG capsule Take 1 capsule (500 mg total) by mouth 4 (four) times daily. 08/03/22  Yes Cristie Hem, MD  ibuprofen (ADVIL) 600 MG tablet Take 1 tablet (600 mg total) by mouth every 6 (six) hours as needed. 08/03/22  Yes Cristie Hem, MD  oxyCODONE (ROXICODONE) 5 MG immediate release tablet Take 1 tablet (5 mg total) by mouth every 4 (four) hours as needed for severe pain. 08/03/22  Yes Cristie Hem, MD                                                                                                                                    Past Surgical History Past Surgical History:  Procedure Laterality Date   HERNIA REPAIR  2011   Family History Family History  Problem Relation Age of Onset   Cancer Mother     Heart failure Father     Social History Social History   Tobacco Use   Smoking status: Former    Packs/day: 0.50    Years: 15.00    Total pack years: 7.50    Types: Cigarettes   Smokeless tobacco: Never  Vaping Use   Vaping Use: Never used  Substance Use Topics   Alcohol use: Yes    Comment: rare   Drug use: No   Allergies Morphine and related  Review of Systems Review of Systems  All other systems reviewed and are negative.   Physical Exam Vital Signs  I have reviewed the triage vital signs BP 122/81   Pulse 83   Resp 17   Ht 6' 1"$  (1.854 m)  Wt 86.2 kg   SpO2 99%   BMI 25.07 kg/m  Physical Exam Vitals and nursing note reviewed.  Constitutional:      General: He is not in acute distress.    Appearance: Normal appearance.  HENT:     Head: Normocephalic and atraumatic.     Mouth/Throat:     Mouth: Mucous membranes are moist.  Eyes:     Conjunctiva/sclera: Conjunctivae normal.  Cardiovascular:     Rate and Rhythm: Normal rate.     Comments: 2+ bilateral DP and PT pulses Pulmonary:     Effort: Pulmonary effort is normal. No respiratory distress.  Abdominal:     General: Abdomen is flat.  Musculoskeletal:     Comments: Range of motion of the toes, ankle intact.  No deformity other than large laceration  Skin:    General: Skin is warm and dry.     Capillary Refill: Capillary refill takes less than 2 seconds.     Comments: Approximately 8 cm laceration to the left medial lower leg in the mid shin area.  Some exposed bone with indentation in bone, couple of small bone fragments present  Neurological:     Mental Status: He is alert. Mental status is at baseline.     Comments: No muscle weakness to the foot, ankle. Extending down from lower wound margin to the upper foot/ankle there is a patch of decreased sensation to light touch  Psychiatric:        Mood and Affect: Mood normal.        Behavior: Behavior normal.     ED Results and  Treatments Labs (all labs ordered are listed, but only abnormal results are displayed) Labs Reviewed - No data to display                                                                                                                        Radiology DG Tibia/Fibula Left  Result Date: 08/03/2022 CLINICAL DATA:  Patient reports chainsaw laceration to the leg. Laceration noted at the mid shaft of the left lower leg. EXAM: LEFT TIBIA AND FIBULA - 2 VIEW COMPARISON:  None Available. FINDINGS: There is no evidence of fracture or other focal bone lesions. Oblique soft tissue defect noted at the anteromedial aspect of the proximal left lower leg. Several 3-4 mm radiodensities project along the soft tissue defect. IMPRESSION: No fracture. Oblique soft tissue defect at the anteromedial aspect of the proximal left lower leg likely corresponds to reported laceration. Several tiny radiodensities project along the soft tissue defect, which may represent foreign bodies. Electronically Signed   By: Ileana Roup M.D.   On: 08/03/2022 15:42    Pertinent labs & imaging results that were available during my care of the patient were reviewed by me and considered in my medical decision making (see MDM for details).  Medications Ordered in ED Medications  HYDROmorphone (DILAUDID) injection 1 mg (1 mg Intravenous Given 08/03/22 1530)  ondansetron (ZOFRAN) injection 4  mg (4 mg Intravenous Given 08/03/22 1530)  lidocaine-EPINEPHrine (XYLOCAINE W/EPI) 2 %-1:200000 (PF) injection 20 mL (20 mLs Intradermal Given 08/03/22 1613)  lidocaine-EPINEPHrine (XYLOCAINE W/EPI) 2 %-1:200000 (PF) injection 20 mL (20 mLs Intradermal Given 08/03/22 1728)                                                                                                                                     Procedures .Marland KitchenLaceration Repair  Date/Time: 08/03/2022 6:33 PM  Performed by: Cristie Hem, MD Authorized by: Cristie Hem, MD   Consent:     Consent obtained:  Verbal   Consent given by:  Patient   Risks, benefits, and alternatives were discussed: yes     Risks discussed:  Infection, need for additional repair, nerve damage, poor cosmetic result, pain, poor wound healing, retained foreign body, tendon damage and vascular damage   Alternatives discussed:  No treatment Universal protocol:    Procedure explained and questions answered to patient or proxy's satisfaction: yes     Patient identity confirmed:  Verbally with patient and arm band Anesthesia:    Anesthesia method:  Local infiltration   Local anesthetic:  Lidocaine 2% WITH epi Laceration details:    Location:  Leg   Leg location:  L lower leg   Length (cm):  8 Pre-procedure details:    Preparation:  Imaging obtained to evaluate for foreign bodies Exploration:    Limited defect created (wound extended): no     Hemostasis achieved with:  Tied off vessels   Imaging obtained: x-ray     Imaging outcome: foreign body noted     Wound exploration: wound explored through full range of motion     Wound extent: fascia violated, foreign bodies/material, nerve damage and underlying fracture     Foreign bodies/material:  2 bone fragments   Contaminated: no   Treatment:    Area cleansed with:  Saline   Amount of cleaning:  Extensive   Irrigation solution:  Sterile saline   Irrigation method:  Syringe   Visualized foreign bodies/material removed: yes     Debridement:  Minimal   Undermining:  None   Scar revision: no     Layers/structures repaired:  Muscle fascia Muscle fascia:    Suture size:  3-0   Suture material:  Vicryl   Suture technique:  Simple interrupted   Number of sutures:  6 Skin repair:    Repair method:  Staples   Number of staples:  14 Approximation:    Approximation:  Close Repair type:    Repair type:  Complex Post-procedure details:    Dressing:  Antibiotic ointment and non-adherent dressing   Procedure completion:  Tolerated well, no immediate  complications   (including critical care time)  Medical Decision Making / ED Course   MDM:  47 year old male presenting to the emergency department after wound from chainsaw.  Patient well-appearing, physical exam with large wound  to the left lateral lower leg with no active bleeding.  There is some exposed bone.  He reports finding bone  fragments in his pants afterwards.  Has already been given Ancef by paramedics and reports last tetanus shot was less than 5 years ago.  Will obtain x-ray to evaluate for underlying fracture.  Will treat pain, irrigate wound.  Repair pending x-ray and possible discussion with orthopedic surgery depending on presence of fracture Clinical Course as of 08/03/22 1834  Sun Aug 03, 2022  1549 DG Tibia/Fibula Left [WS]  1829 Discussed with Higinio Roger, PA for orthopedic surgery.  Given very small injury to the bone not even visible on x-ray she recommends closure in the emergency department.  Discussed potential cutaneous nerve injury, she recommends outpatient follow-up as there is no specific treatment for this.  Wound closed, see procedure note for details.  Wound was thoroughly irrigated prior to closure.  There were 2 possible foreign body seen on the x-ray, removed 2 small bone chips which likely correlated to the foreign body seen on x-ray.  Will cover with Keflex.  Prescribed oxycodone for pain.  [place] Controlled Substance Reporting System database was reviewed. and patient was instructed, not to drive, operate heavy machinery, perform activities at heights, swimming or participation in water activities or provide baby-sitting services while on Pain, Sleep and Anxiety Medications; until their outpatient Physician has advised to do so again. Also recommended to not to take more than prescribed Pain, Sleep and Anxiety Medications. Will discharge patient to home. All questions answered. Patient comfortable with plan of discharge. Return precautions  discussed with patient and specified on the after visit summary.  [WS]    Clinical Course User Index [WS] Cristie Hem, MD     Additional history obtained: -Additional history obtained from ems -External records from outside source obtained and reviewed including: Chart review including previous notes, labs, imaging, consultation notes including office visit 02/06/23   Imaging Studies ordered: I ordered imaging studies including XR leg On my interpretation imaging demonstrates 2 tiny foreign bodies likely bone chips I independently visualized and interpreted imaging. I agree with the radiologist interpretation   Medicines ordered and prescription drug management: Meds ordered this encounter  Medications   HYDROmorphone (DILAUDID) injection 1 mg   ondansetron (ZOFRAN) injection 4 mg   DISCONTD: bupivacaine-epinephrine (PF) (MARCAINE W/ EPI) 0.5% -1:200000 injection 20 mL   lidocaine-EPINEPHrine (XYLOCAINE W/EPI) 2 %-1:200000 (PF) injection 20 mL   lidocaine-EPINEPHrine (XYLOCAINE W/EPI) 2 %-1:200000 (PF) injection 20 mL   cephALEXin (KEFLEX) 500 MG capsule    Sig: Take 1 capsule (500 mg total) by mouth 4 (four) times daily.    Dispense:  20 capsule    Refill:  0   oxyCODONE (ROXICODONE) 5 MG immediate release tablet    Sig: Take 1 tablet (5 mg total) by mouth every 4 (four) hours as needed for severe pain.    Dispense:  10 tablet    Refill:  0   ibuprofen (ADVIL) 600 MG tablet    Sig: Take 1 tablet (600 mg total) by mouth every 6 (six) hours as needed.    Dispense:  30 tablet    Refill:  0    -I have reviewed the patients home medicines and have made adjustments as needed   Consultations Obtained: I requested consultation with the orthopedist,  and discussed lab and imaging findings as well as pertinent plan - they recommend: repair in ER   Cardiac Monitoring: The patient was  maintained on a cardiac monitor.  I personally viewed and interpreted the cardiac  monitored which showed an underlying rhythm of: NSR  Social Determinants of Health:  Diagnosis or treatment significantly limited by social determinants of health: former smoker   Reevaluation: After the interventions noted above, I reevaluated the patient and found that they have improved  Co morbidities that complicate the patient evaluation  Past Medical History:  Diagnosis Date   Arthritis    of the back   Back pain    Bulging lumbar disc 2020   Three diagnosed.   Mitral regurgitation       Dispostion: Disposition decision including need for hospitalization was considered, and patient discharged from emergency department.    Final Clinical Impression(s) / ED Diagnoses Final diagnoses:  Leg laceration, left, initial encounter     This chart was dictated using voice recognition software.  Despite best efforts to proofread,  errors can occur which can change the documentation meaning.    Cristie Hem, MD 08/03/22 [phone]

## 2024-01-01 ENCOUNTER — Ambulatory Visit: Payer: Self-pay | Admitting: Surgery

## 2024-01-01 NOTE — H&P (Signed)
 Subjective    Chief Complaint: New Consultation ( Lt inguinal hernia)       History of Present Illness: Sean Cuevas is a 48 y.o. male who is seen today as an office consultation at the request of Dr. Verdia for evaluation of New Consultation ( Lt inguinal hernia) .     This is a 48 year old male who presents with a 30-month history of a palpable mass in his left groin.  This has become larger and mildly uncomfortable.  He denies any obstructive symptoms.  He was examined by his PCP who diagnosed him with a left inguinal hernia.  This was reducible.  He is referred to us  for surgical intervention.   The patient has a history of a right inguinal hernia that was repaired about 15 years ago in [place].  They did use mesh.  It was performed using open technique.     Review of Systems: A complete review of systems was obtained from the patient.  I have reviewed this information and discussed as appropriate with the patient.  See HPI as well for other ROS.   Review of Systems  Constitutional: Negative.   HENT: Negative.    Eyes: Negative.   Respiratory: Negative.    Cardiovascular: Negative.   Gastrointestinal: Negative.   Genitourinary: Negative.   Musculoskeletal: Negative.   Skin: Negative.   Neurological: Negative.   Endo/Heme/Allergies: Negative.   Psychiatric/Behavioral: Negative.          Medical History: Past Medical History      Past Medical History:  Diagnosis Date   Arthritis          Problem List     Patient Active Problem List  Diagnosis   Carpal tunnel syndrome on right        Past Surgical History       Past Surgical History:  Procedure Laterality Date   TONSILLECTOMY   1997   INGUINAL HERNIA REPAIR   2012   .carpal tunnel   2017        Allergies  Not on File     Medications Ordered Prior to Encounter        Current Outpatient Medications on File Prior to Visit  Medication Sig Dispense Refill   sildenafiL (VIAGRA) 100 MG tablet  Take 100 mg by mouth as needed        No current facility-administered medications on file prior to visit.        Family History       Family History  Problem Relation Age of Onset   High blood pressure (Hypertension) Mother     Heart failure Father     Coronary Artery Disease (Blocked arteries around heart) Father     High blood pressure (Hypertension) Father     Stroke Sister          Tobacco Use History  Social History        Tobacco Use  Smoking Status Former   Types: Cigarettes  Smokeless Tobacco Never        Social History  Social History         Socioeconomic History   Marital status: Married  Tobacco Use   Smoking status: Former      Types: Cigarettes   Smokeless tobacco: Never  Vaping Use   Vaping status: Never Used  Substance and Sexual Activity   Alcohol use: Never   Drug use: Never    Social Drivers of Health  Housing Stability: Unknown (01/01/2024)    Housing Stability Vital Sign     Homeless in the Last Year: No        Objective:          Vitals:    01/01/24 0920 01/01/24 0923  BP: 124/79    Pulse: 71    Temp: 36.6 C (97.8 F)    TempSrc: Temporal    SpO2: 99%    Weight: 86.9 kg (191 lb 9.6 oz)    Height: 185.4 cm (6' 1)    PainSc:   0-No pain    Body mass index is 25.28 kg/m.   Physical Exam    Constitutional:  WDWN in NAD, conversant, no obvious deformities; lying in bed comfortably Eyes:  Pupils equal, round; sclera anicteric; moist conjunctiva; no lid lag HENT:  Oral mucosa moist; good dentition  Neck:  No masses palpated, trachea midline; no thyromegaly Lungs:  CTA bilaterally; normal respiratory effort CV:  Regular rate and rhythm; no murmurs; extremities well-perfused with no edema Abd:  +bowel sounds, soft, non-tender, no palpable organomegaly; no palpable hernias GU: Bilateral descended testes, no testicular masses, healed right inguinal incision with no sign of recurrent hernia. Visible palpable left  inguinal hernia.  This is reducible. Musc: Normal gait; no apparent clubbing or cyanosis in extremities Lymphatic:  No palpable cervical or axillary lymphadenopathy Skin:  Warm, dry; no sign of jaundice Psychiatric - alert and oriented x 4; calm mood and affect     Assessment and Plan:  Diagnoses and all orders for this visit:   Left inguinal hernia       Recommend left inguinal hernia repair with mesh.The surgical procedure has been discussed with the patient.  Potential risks, benefits, alternative treatments, and expected outcomes have been explained.  All of the patient's questions at this time have been answered.  The likelihood of reaching the patient's treatment goal is good.  The patient understands the proposed surgical procedure and wishes to proceed.     DONNICE DEWAYNE LIMA, MD  01/01/2024 9:37 AM
